# Patient Record
Sex: Male | Born: 1945 | Race: White | Hispanic: No | Marital: Married | Smoking: Never smoker
Health system: Southern US, Community
[De-identification: ages and names within clinical notes are randomized; demographics above are authoritative.]

## PROBLEM LIST (undated history)

## (undated) DIAGNOSIS — E119 Type 2 diabetes mellitus without complications: Secondary | ICD-10-CM

## (undated) DIAGNOSIS — K3184 Gastroparesis: Secondary | ICD-10-CM

## (undated) DIAGNOSIS — R011 Cardiac murmur, unspecified: Secondary | ICD-10-CM

## (undated) DIAGNOSIS — IMO0002 Reserved for concepts with insufficient information to code with codable children: Secondary | ICD-10-CM

## (undated) HISTORY — PX: CATARACT EXTRACTION, BILATERAL: SHX1313

## (undated) HISTORY — DX: Type 2 diabetes mellitus without complications: E11.9

## (undated) HISTORY — DX: Cardiac murmur, unspecified: R01.1

## (undated) HISTORY — DX: Gastroparesis: K31.84

## (undated) HISTORY — DX: Reserved for concepts with insufficient information to code with codable children: IMO0002

---

## 2000-04-22 ENCOUNTER — Emergency Department (HOSPITAL_COMMUNITY): Admission: EM | Admit: 2000-04-22 | Discharge: 2000-04-22 | Payer: Self-pay

## 2001-04-30 ENCOUNTER — Emergency Department (HOSPITAL_COMMUNITY): Admission: EM | Admit: 2001-04-30 | Discharge: 2001-04-30 | Payer: Self-pay | Admitting: Emergency Medicine

## 2001-04-30 ENCOUNTER — Encounter: Payer: Self-pay | Admitting: Emergency Medicine

## 2003-08-14 ENCOUNTER — Encounter: Payer: Self-pay | Admitting: Emergency Medicine

## 2003-08-14 ENCOUNTER — Emergency Department (HOSPITAL_COMMUNITY): Admission: EM | Admit: 2003-08-14 | Discharge: 2003-08-14 | Payer: Self-pay | Admitting: Emergency Medicine

## 2005-02-22 ENCOUNTER — Ambulatory Visit: Payer: Self-pay

## 2005-02-22 HISTORY — PX: COLONOSCOPY: SHX174

## 2005-05-17 ENCOUNTER — Ambulatory Visit: Payer: Self-pay | Admitting: Internal Medicine

## 2005-11-04 HISTORY — PX: CERVICAL DISC SURGERY: SHX588

## 2005-12-18 ENCOUNTER — Ambulatory Visit: Payer: Self-pay

## 2006-02-28 ENCOUNTER — Observation Stay (HOSPITAL_COMMUNITY): Admission: RE | Admit: 2006-02-28 | Discharge: 2006-03-01 | Payer: Self-pay | Admitting: Neurosurgery

## 2007-04-21 ENCOUNTER — Ambulatory Visit: Payer: Self-pay | Admitting: Internal Medicine

## 2007-11-20 ENCOUNTER — Ambulatory Visit: Payer: Self-pay

## 2008-07-24 IMAGING — US US CAROTID DUPLEX BILAT
1 series · 17 of 24 positions shown · non-contrast
Comparison: none

REASON FOR EXAM: Dizziness Blurred Vision Headache
COMMENTS:

PROCEDURE:     US  - US CAROTID DOPPLER BILATERAL  - November 20, 2007  [DATE]
RESULT:     Comparison: No available comparison exam.

[Series 1: us carotid duplex bilat · 17 of 65 slices shown]
[im 1/65]
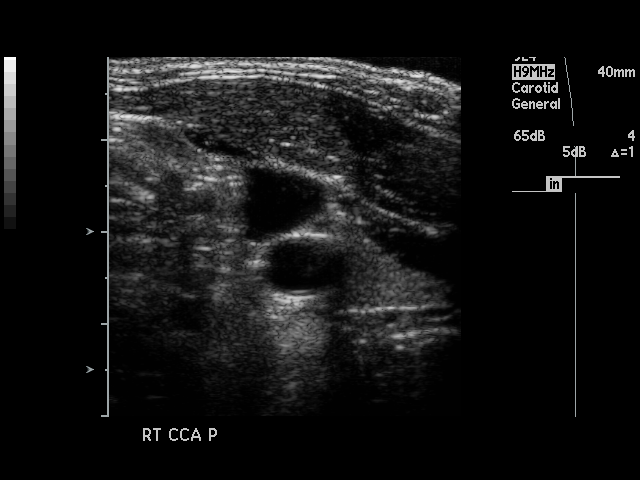
[im 6/65]
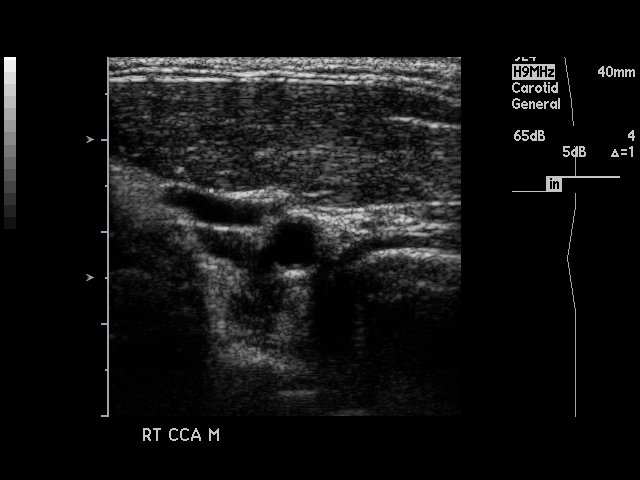
[im 9/65]
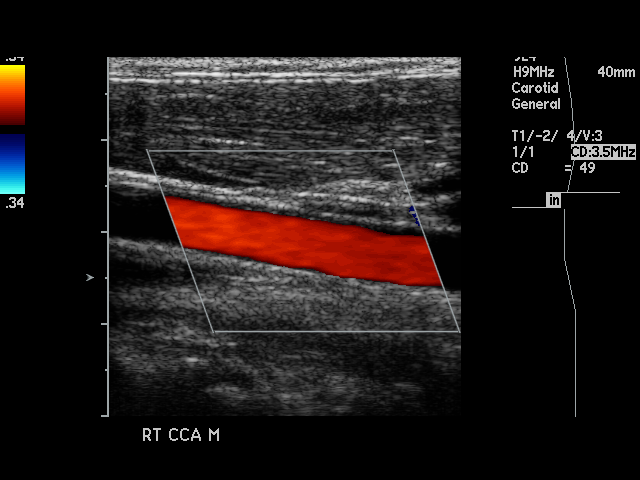
[im 12/65]
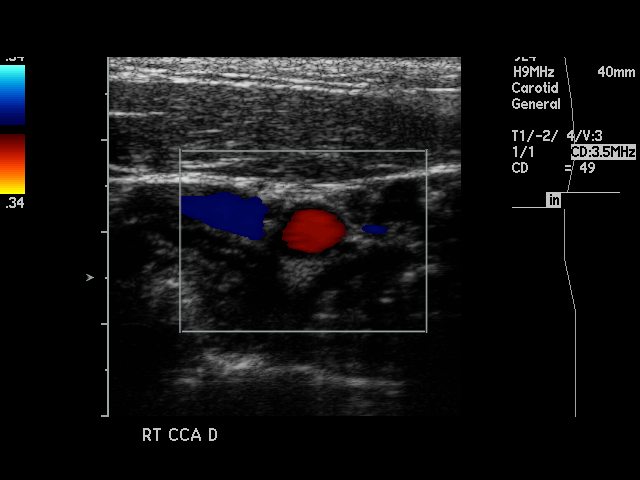
[im 17/65]
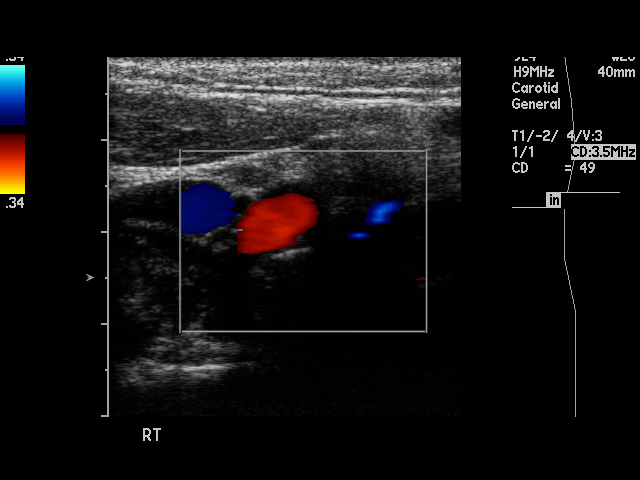
[im 20/65]
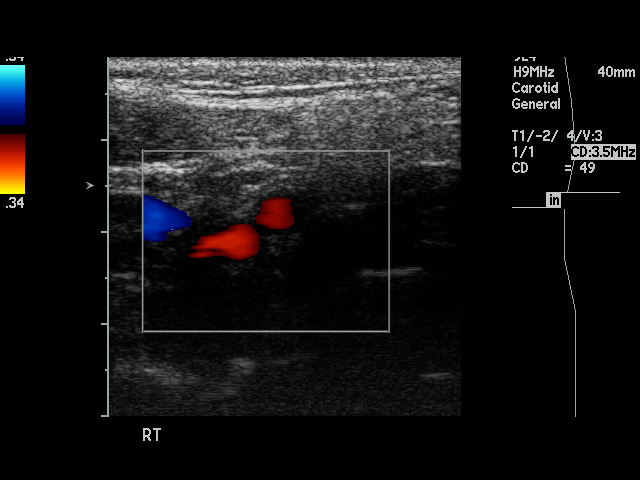
[im 26/65]
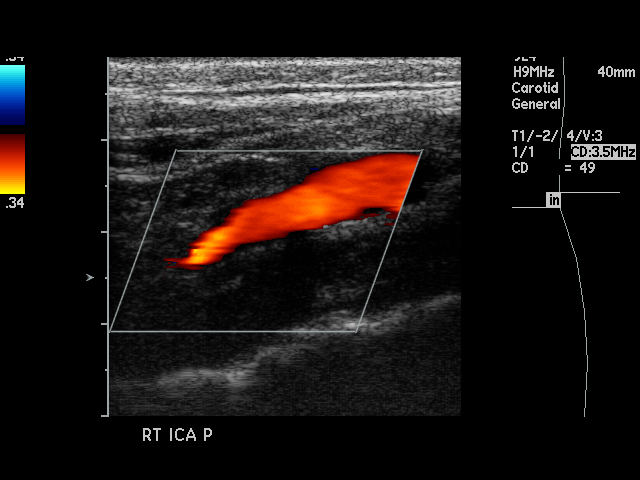
[im 28/65]
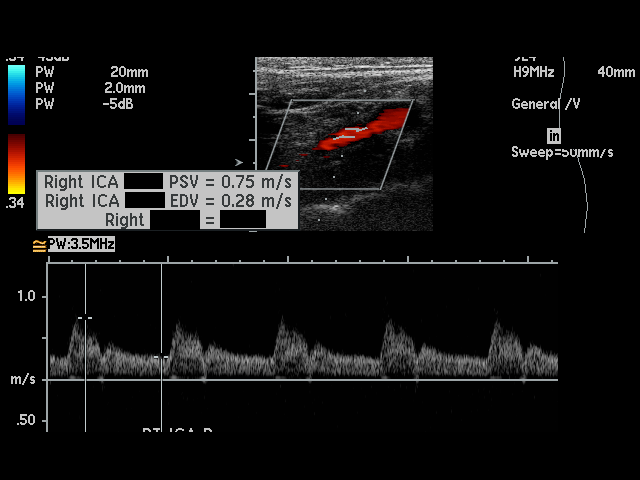
[im 34/65]
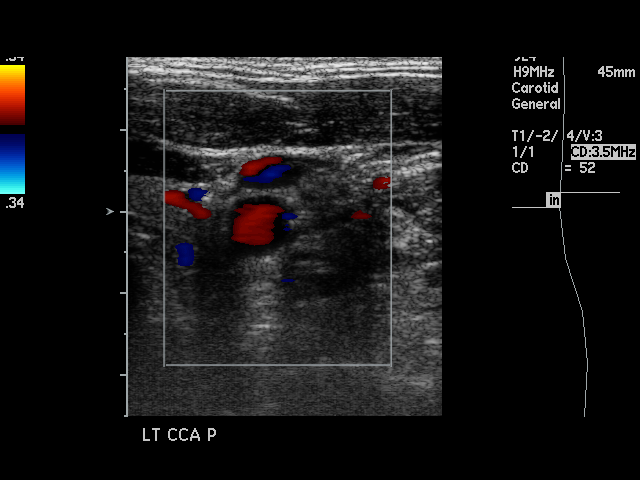
[im 37/65]
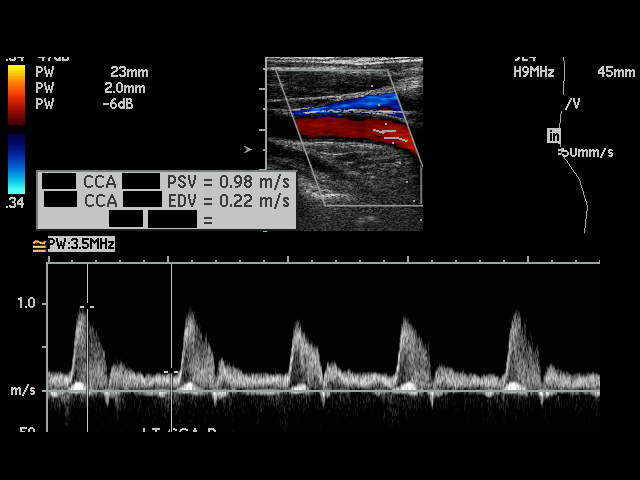
[im 39/65]
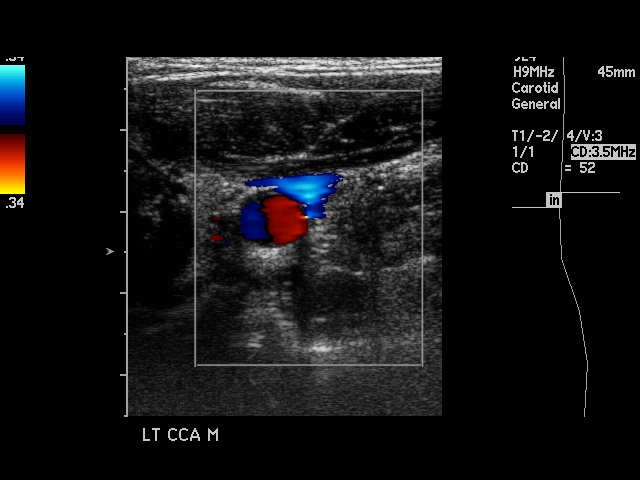
[im 45/65]
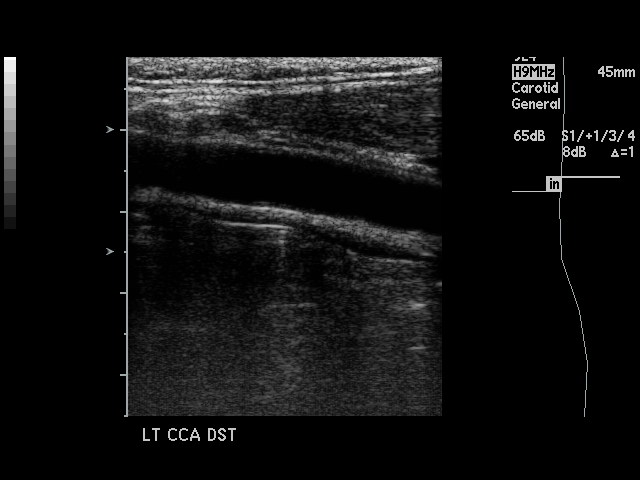
[im 48/65]
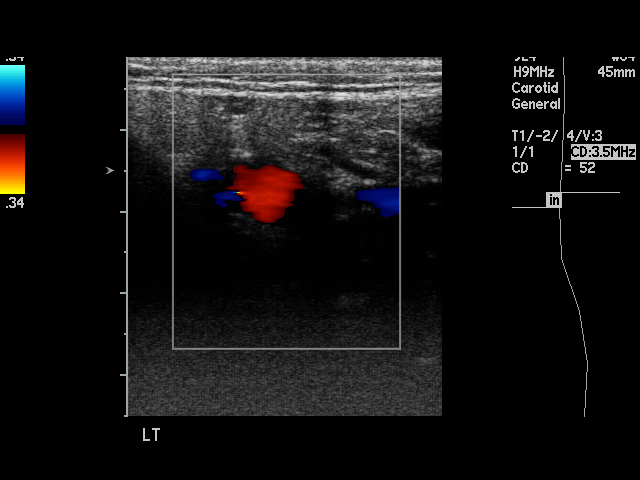
[im 53/65]
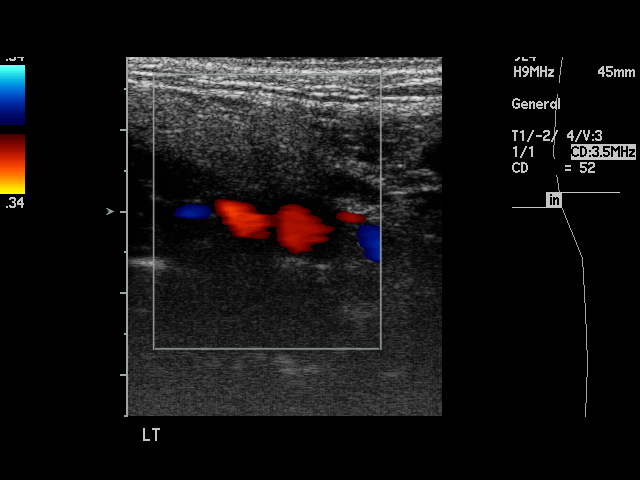
[im 56/65]
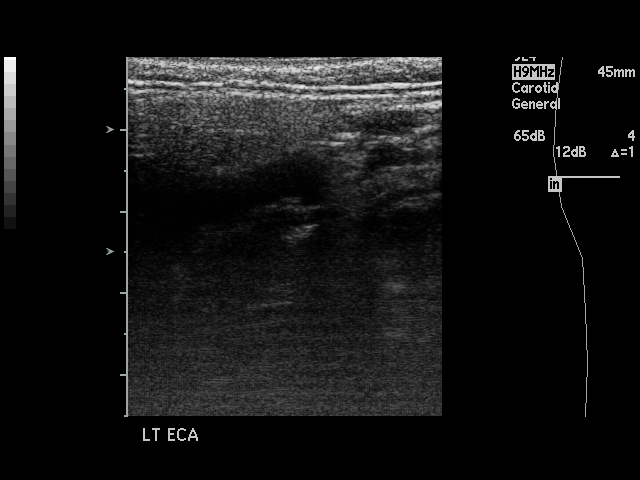
[im 59/65]
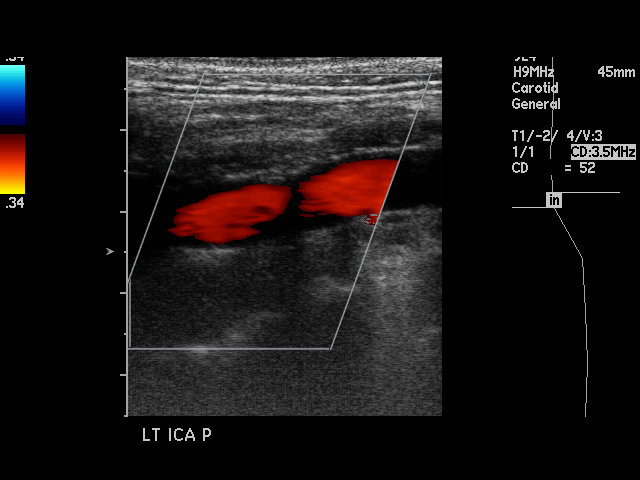
[im 65/65]
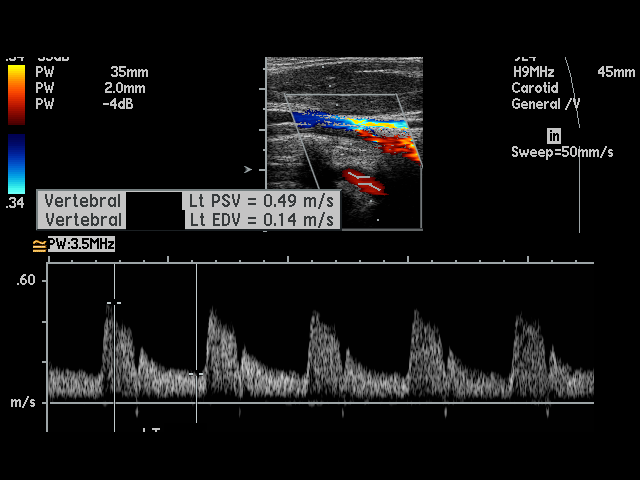

[17 of 24 positions shown; findings below may reference images not displayed]

FINDINGS: RIGHT:
The common carotid artery peak systolic velocity is 99 cm/s.
The internal carotid artery  peak systolic velocity is 92 cm/s.
The ratio of the  ICA/CCA is 0.93.
The external carotid artery is patent and has antegrade flow.
Antegrade flow identified in the vertebral artery.

No significant atherosclerotic changes are noted.

LEFT
The common carotid artery peak systolic velocity is 100 cm/s.
The internal carotid artery  peak systolic velocity is 86 cm/s.
The ratio of the  ICA/CCA is 0.86.
The external carotid artery is patent and has antegrade flow.
Antegrade flow identified in the vertebral artery.

Mild noncalcified plaque is seen involving the carotid bulb.
IMPRESSION: 1. Mild noncalcified plaque is seen involving the left carotid bulb without
evidence of hemodynamically significant stenosis.

## 2012-04-16 LAB — BASIC METABOLIC PANEL
Anion Gap: 7 (ref 7–16)
Calcium, Total: 10.2 mg/dL — ABNORMAL HIGH (ref 8.5–10.1)
Chloride: 99 mmol/L (ref 98–107)
Co2: 30 mmol/L (ref 21–32)
Creatinine: 1.2 mg/dL (ref 0.60–1.30)
Sodium: 136 mmol/L (ref 136–145)

## 2012-04-16 LAB — CBC
HCT: 40.9 % (ref 40.0–52.0)
MCH: 30.9 pg (ref 26.0–34.0)
Platelet: 222 10*3/uL (ref 150–440)
RBC: 4.51 10*6/uL (ref 4.40–5.90)
RDW: 12.6 % (ref 11.5–14.5)
WBC: 10.4 10*3/uL (ref 3.8–10.6)

## 2012-04-16 LAB — CK TOTAL AND CKMB (NOT AT ARMC): CK, Total: 146 U/L (ref 35–232)

## 2012-04-17 ENCOUNTER — Inpatient Hospital Stay: Payer: Self-pay | Admitting: Internal Medicine

## 2012-04-17 LAB — CBC WITH DIFFERENTIAL/PLATELET
Basophil #: 0 10*3/uL (ref 0.0–0.1)
Basophil %: 0.3 %
Eosinophil #: 0.1 10*3/uL (ref 0.0–0.7)
HCT: 39.9 % — ABNORMAL LOW (ref 40.0–52.0)
Lymphocyte #: 2.8 10*3/uL (ref 1.0–3.6)
MCH: 31.3 pg (ref 26.0–34.0)
MCHC: 34.4 g/dL (ref 32.0–36.0)
MCV: 91 fL (ref 80–100)
Platelet: 221 10*3/uL (ref 150–440)
RBC: 4.38 10*6/uL — ABNORMAL LOW (ref 4.40–5.90)
RDW: 12.7 % (ref 11.5–14.5)
WBC: 10.7 10*3/uL — ABNORMAL HIGH (ref 3.8–10.6)

## 2012-04-17 LAB — COMPREHENSIVE METABOLIC PANEL
Albumin: 3.9 g/dL (ref 3.4–5.0)
Chloride: 102 mmol/L (ref 98–107)
Creatinine: 1.16 mg/dL (ref 0.60–1.30)
EGFR (African American): >60
EGFR (Non-African Amer.): >60
SGOT(AST): 14 U/L — ABNORMAL LOW (ref 15–37)
Sodium: 138 mmol/L (ref 136–145)
Total Protein: 7.5 g/dL (ref 6.4–8.2)

## 2012-04-17 LAB — TROPONIN I
Troponin-I: 0.02 ng/mL
Troponin-I: <0.02 ng/mL

## 2012-04-17 LAB — LIPID PANEL
HDL Cholesterol: 41 mg/dL (ref 40–60)
Ldl Cholesterol, Calc: 111 mg/dL — ABNORMAL HIGH (ref 0–100)
Triglycerides: 68 mg/dL (ref 0–200)

## 2012-04-17 LAB — CK TOTAL AND CKMB (NOT AT ARMC): CK-MB: 3.4 ng/mL (ref 0.5–3.6)

## 2012-04-27 HISTORY — PX: AORTIC VALVE REPLACEMENT (AVR)/CORONARY ARTERY BYPASS GRAFTING (CABG): SHX5725

## 2012-05-14 ENCOUNTER — Ambulatory Visit: Payer: Self-pay | Admitting: Internal Medicine

## 2012-12-15 HISTORY — PX: UPPER GASTROINTESTINAL ENDOSCOPY: SHX188

## 2012-12-20 IMAGING — US ABDOMEN ULTRASOUND LIMITED
1 series · 14 of 25 positions shown · non-contrast
Comparison: none

REASON FOR EXAM: ruq pain, leukocytosis
COMMENTS:   Body Site: GB and Fossa, CBD, Head of Pancreas

PROCEDURE:     US  - US ABDOMEN LIMITED SURVEY  - April 17, 2012  [DATE]
RESULT:

[Series 1: abdomen ultrasound limited · 0.31mm/px · 14 of 57 slices shown]
[im 1/57]
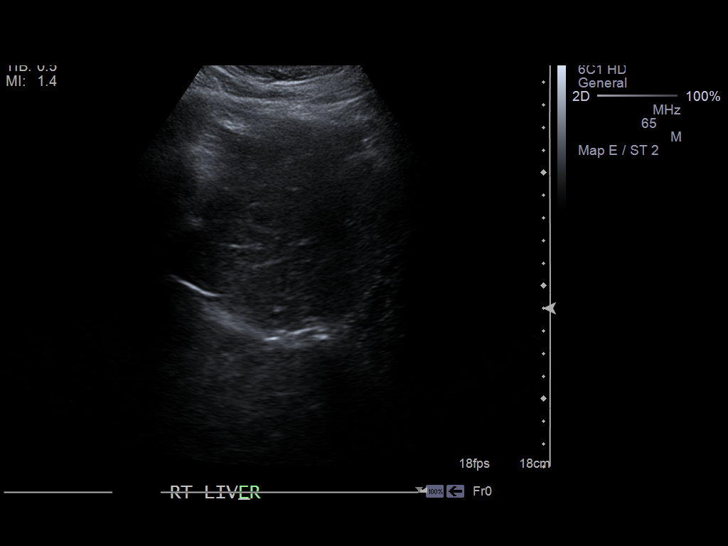
[im 5/57]
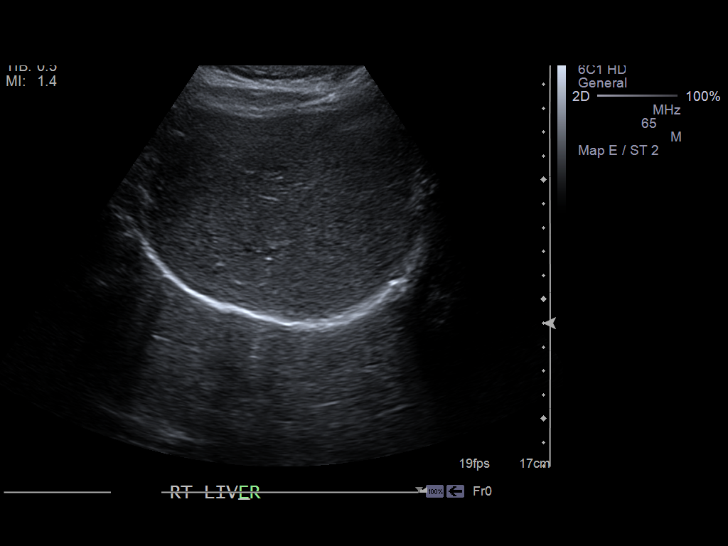
[im 10/57]
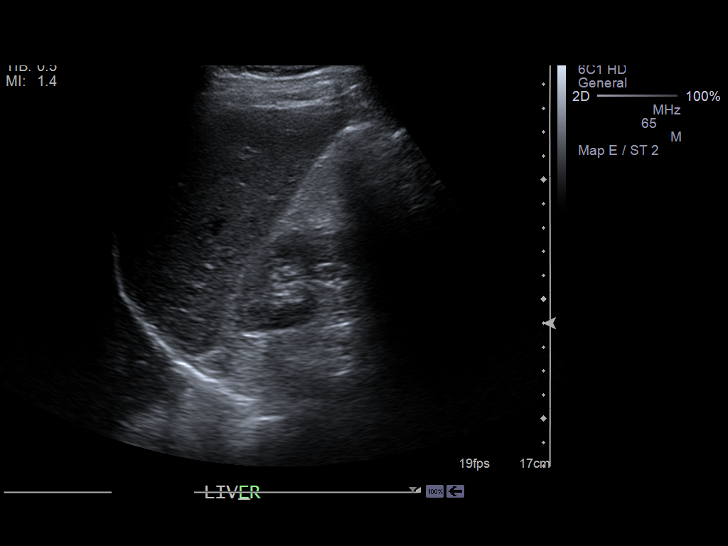
[im 15/57]
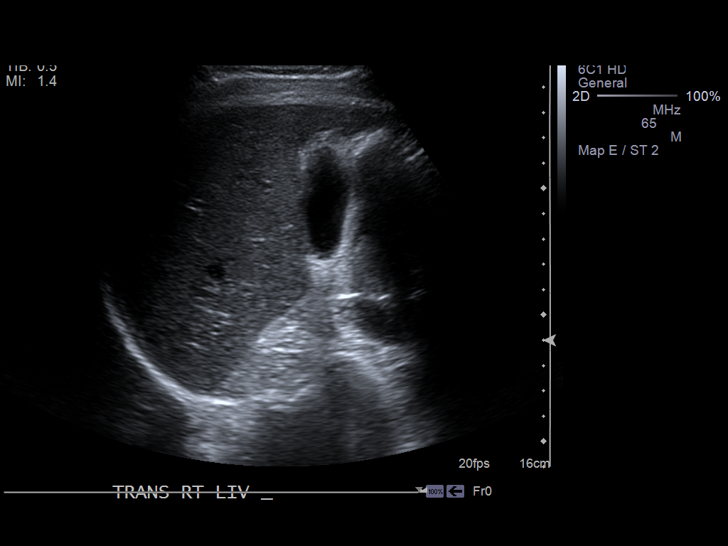
[im 19/57]
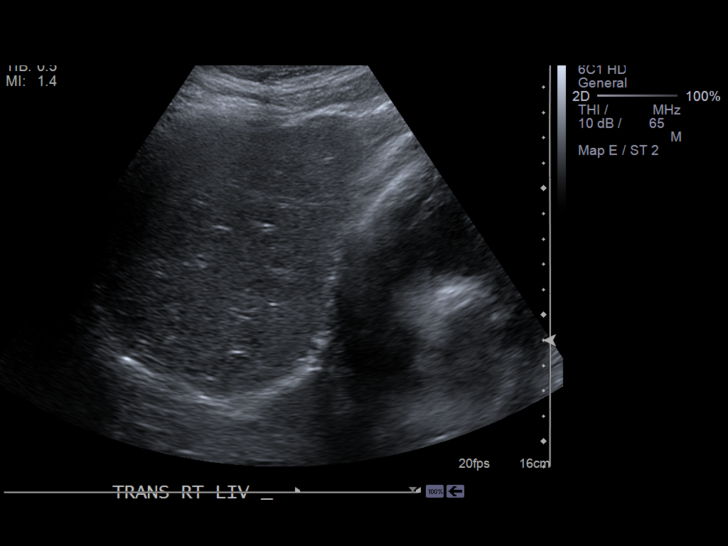
[im 22/57]
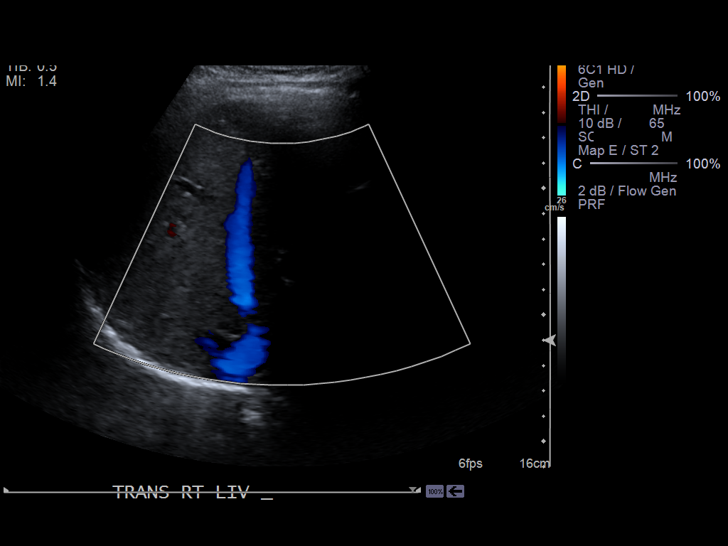
[im 26/57]
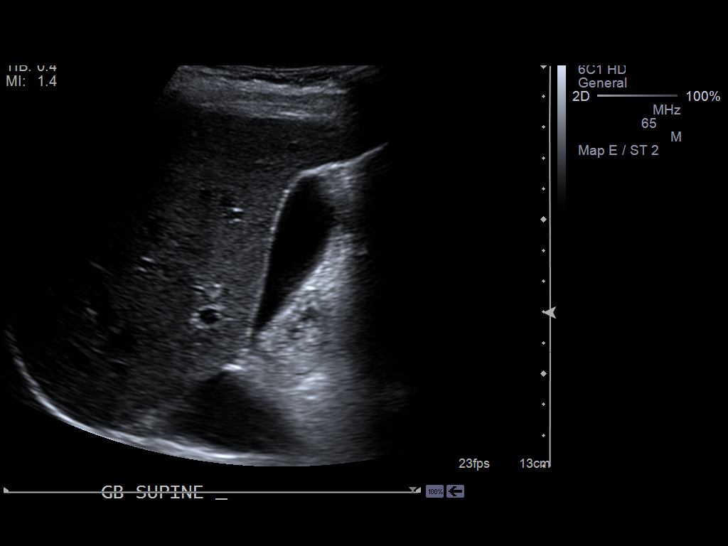
[im 31/57]
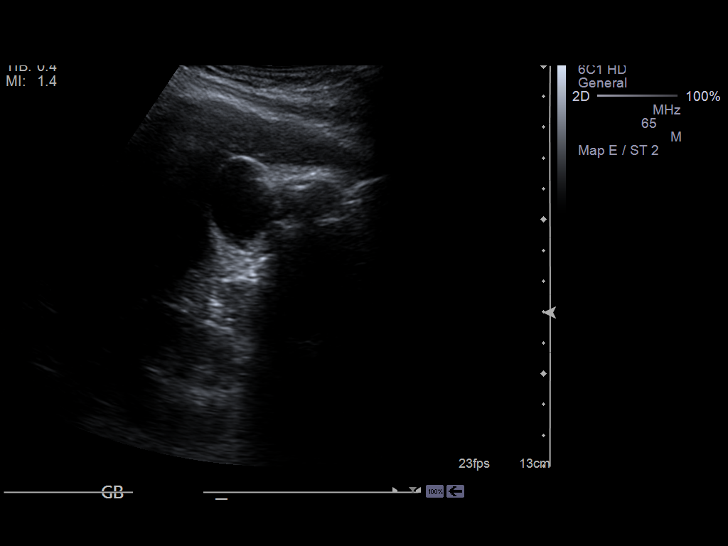
[im 36/57]
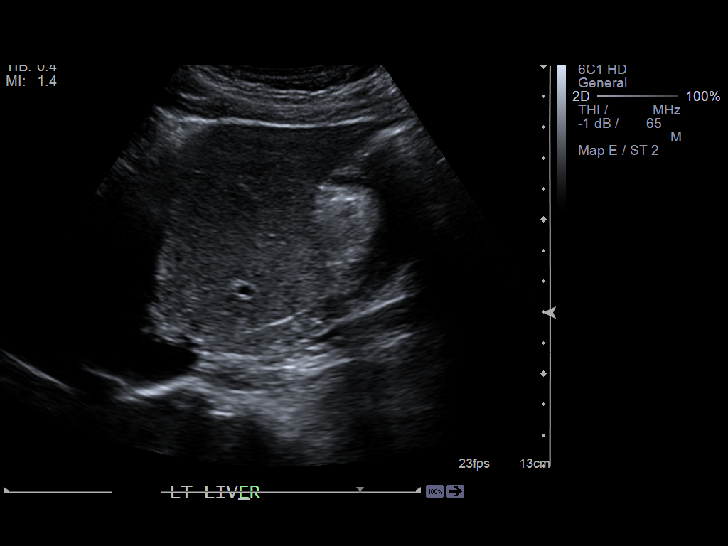
[im 38/57]
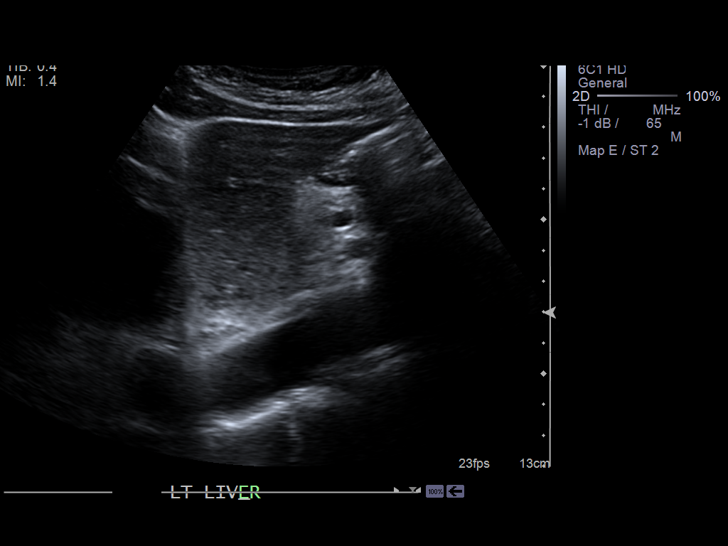
[im 43/57]
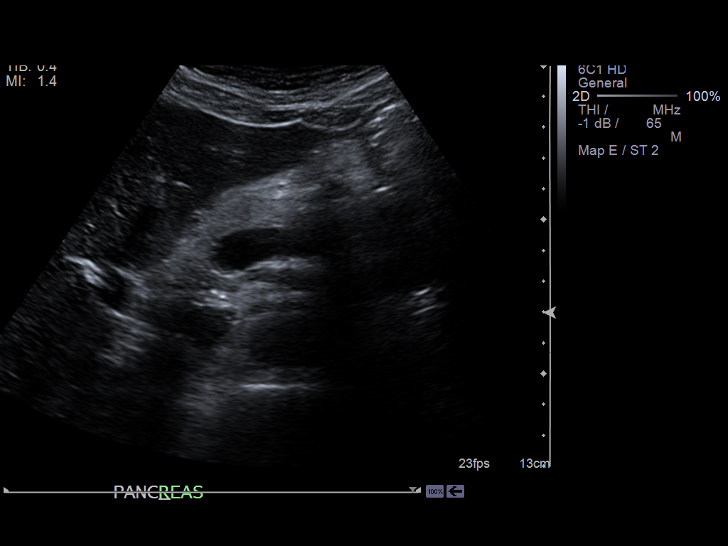
[im 47/57]
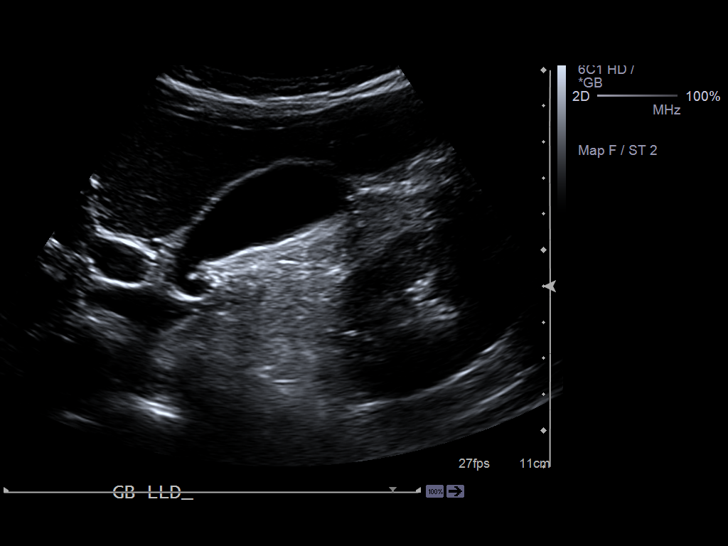
[im 52/57]
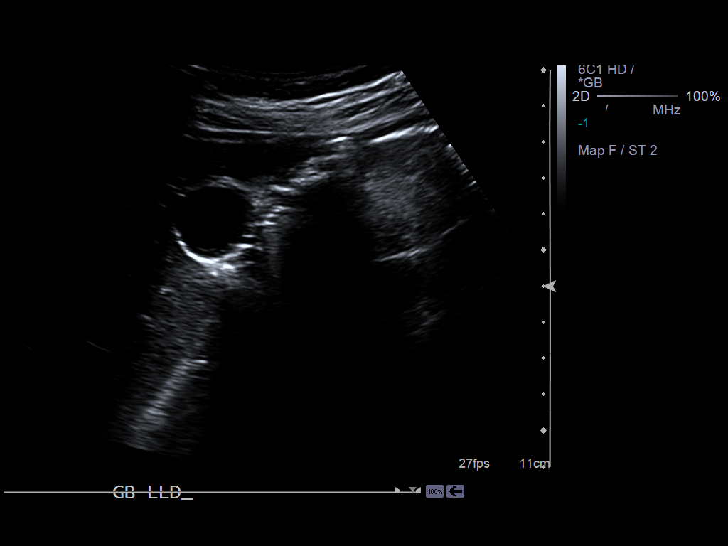
[im 57/57]
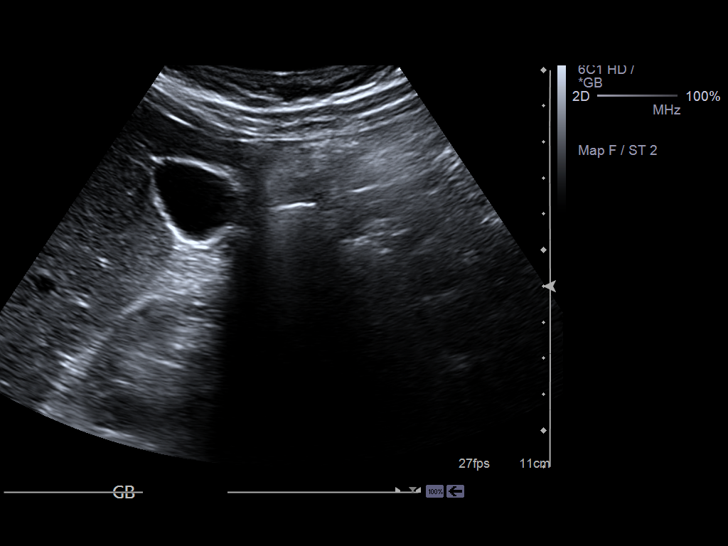

[14 of 25 positions shown; findings below may reference images not displayed]

FINDINGS: The liver demonstrates a homogeneous echotexture. Evaluation of
the gallbladder fossa demonstrates no evidence of pericholecystic fluid,
gallstones, sludging nor gallbladder wall thickening. Gallbladder wall
thickness is 1 mm. The common bile duct measures 4.1 mm in diameter. The
pancreatic head is unremarkable. Hepatopetal flow is identified within the
porta hepatis.
IMPRESSION: Unremarkable right upper quadrant abdominal ultrasound.

## 2013-01-16 IMAGING — US US EXTREM LOW VENOUS*L*
1 series · 14 of 23 positions shown · non-contrast
Comparison: none

REASON FOR EXAM: edema  eval DVT  dr office requested [REDACTED]
COMMENTS:

[Series 1: us extrem low venous*left* · 0.09mm/px · 14 of 23 slices shown]
[im 1/23]
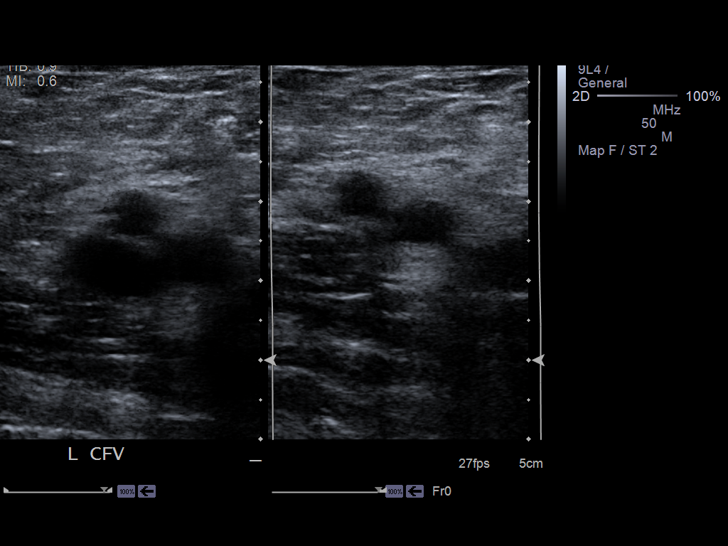
[im 3/23]
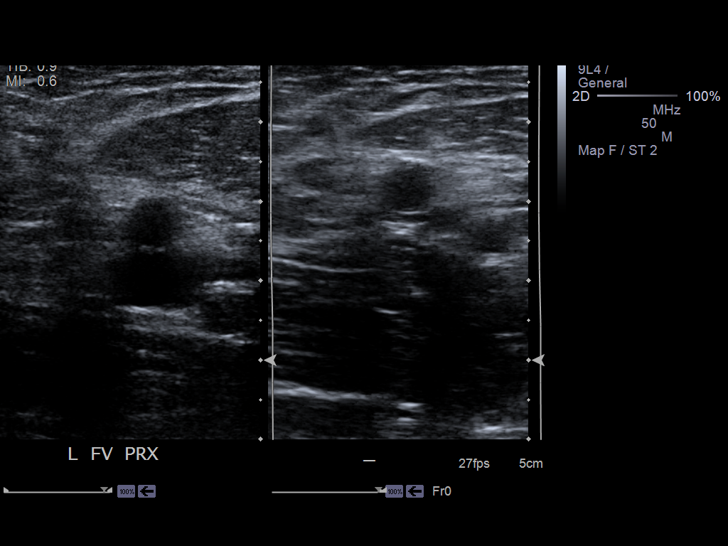
[im 5/23]
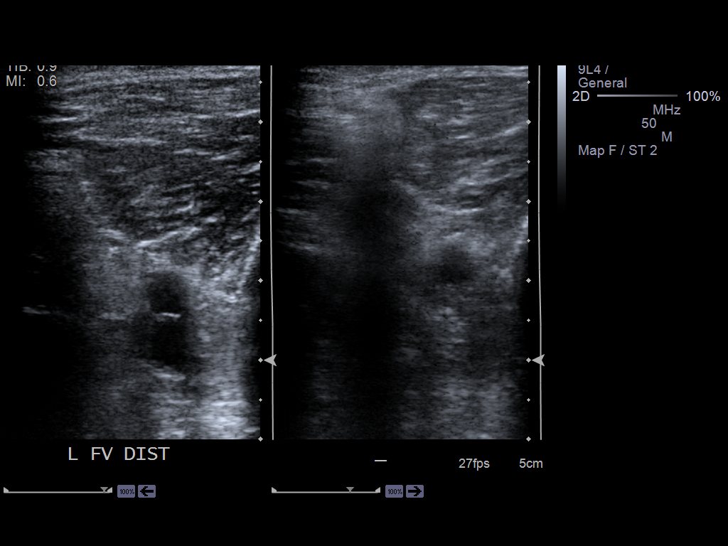
[im 6/23]
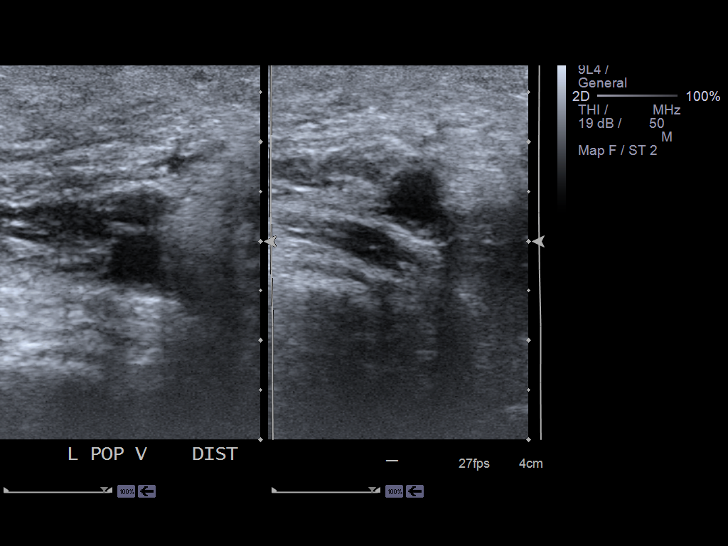
[im 8/23]
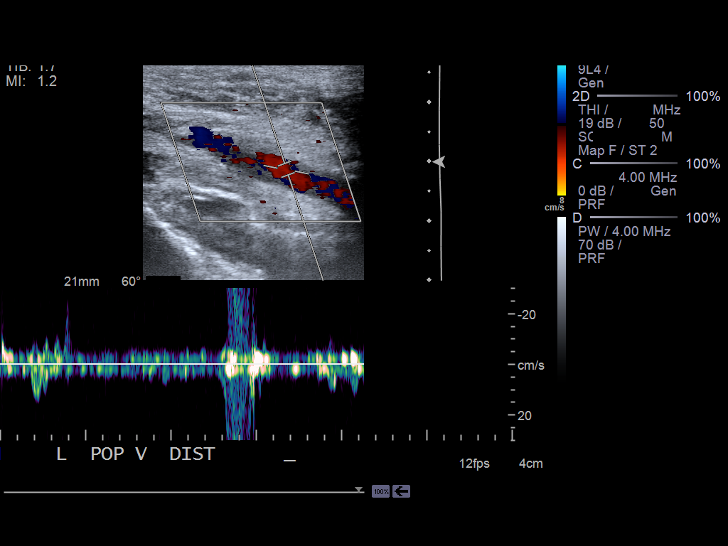
[im 10/23]
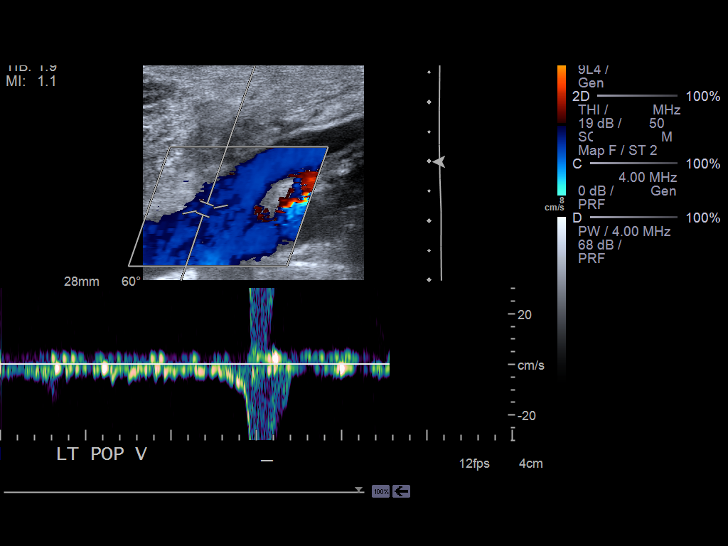
[im 11/23]
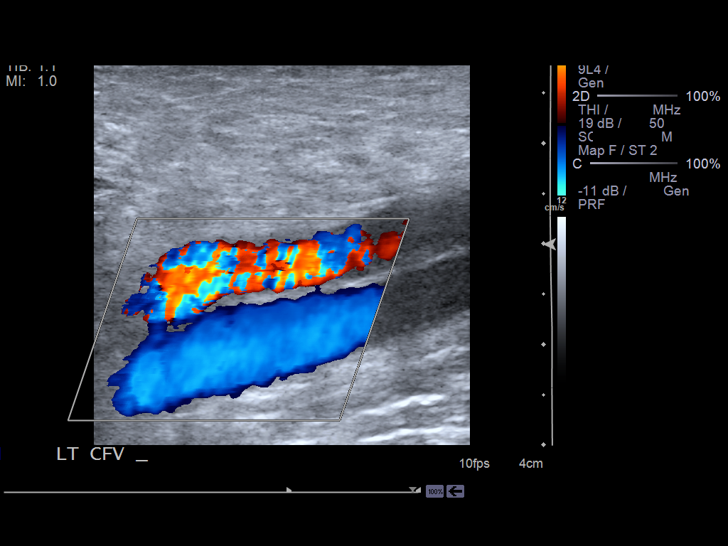
[im 13/23]
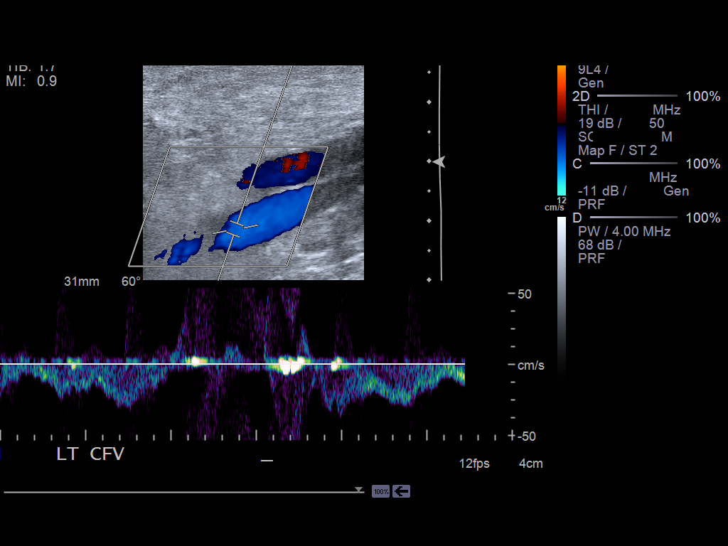
[im 14/23]
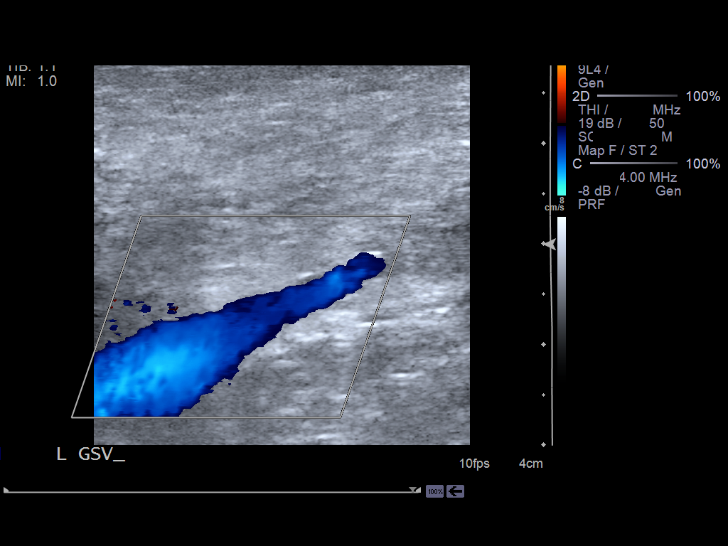
[im 16/23]
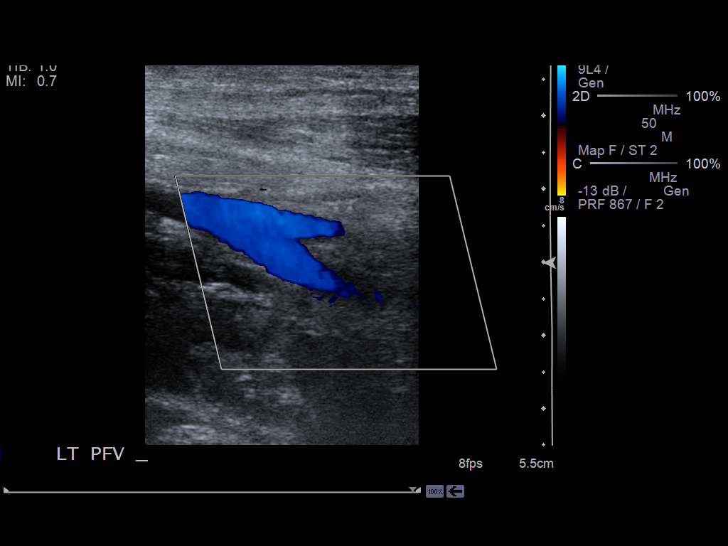
[im 18/23]
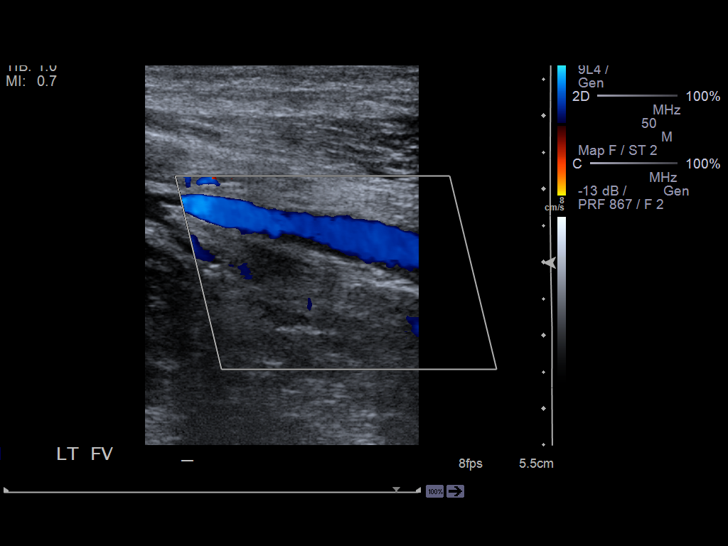
[im 19/23]
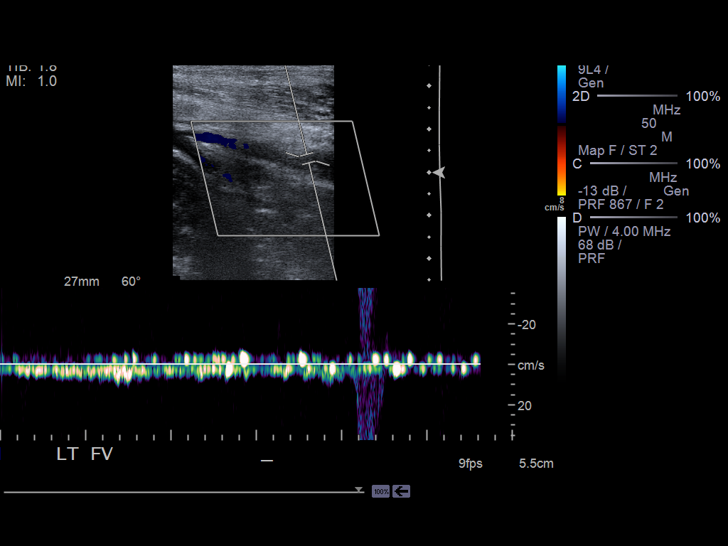
[im 21/23]
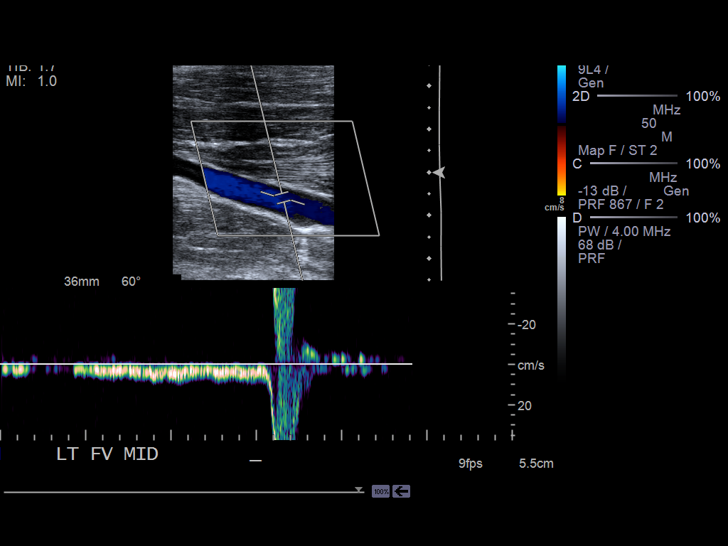
[im 23/23]
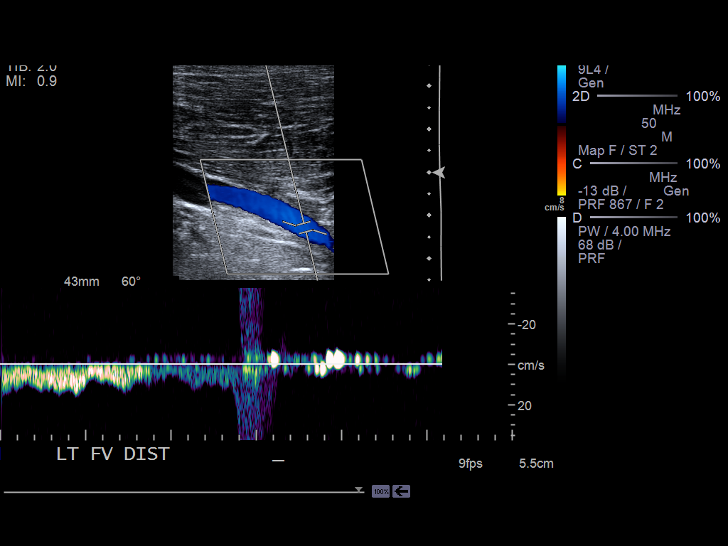

[14 of 23 positions shown; findings below may reference images not displayed]

PROCEDURE:     US  - US DOPPLER LOW EXTR LEFT  - May 14, 2012  [DATE]

RESULT:     Comparison: None

Technique and findings: Multiple longitudinal and transverse grayscale as
well as color and spectral Doppler images of the left lower extremity veins
were obtained from the common femoral veins through the popliteal veins.

The left common femoral, femoral, and popliteal veins are patent,
demonstrating normal color-flow and compressibility. No intraluminal
thrombus is identified.  There is normal respiratory variation and
augmentation demonstrated at all vein levels.
IMPRESSION: No evidence of DVT in the left lower extremity.

[REDACTED]

## 2014-04-18 ENCOUNTER — Ambulatory Visit (AMBULATORY_SURGERY_CENTER): Payer: Medicare Other | Admitting: *Deleted

## 2014-04-18 ENCOUNTER — Telehealth: Payer: Self-pay | Admitting: *Deleted

## 2014-04-18 VITALS — Ht 68.0 in | Wt 145.4 lb

## 2014-04-18 DIAGNOSIS — Z86010 Personal history of colonic polyps: Secondary | ICD-10-CM

## 2014-04-18 MED ORDER — MOVIPREP 100 G PO SOLR: ORAL | Status: AC

## 2014-04-18 NOTE — Telephone Encounter (Signed)
Patient is direct screening colonoscopy on 05/03/14. Records given during pre-visit for colon & EGD and labs. Last colonoscopy was 02/22/2005, hyperplastic polyps. Patient denies any GI concerns, but does have a 1 st cousin with colon cancer age 135. Patient was referred by Dr.Miller (PCP). Records on desk for review. Ok for direct colonoscopy at this time? Thank you,Luca Dyar

## 2014-04-18 NOTE — Progress Notes (Signed)
Patient denies any allergies to eggs or soy. Patient gets nauseated with anesthesia, no problems with sedation. Patient denies any oxygen use at home and does not take any diet/weight loss medications. Patient does not use computer. Phone note sent to Dr.Pyrtle and records on desk for review.

## 2014-04-19 NOTE — Telephone Encounter (Signed)
Colonoscopy reviewed from 02/22/2005 Complete colonoscopy with exam to the cecum with a good bowel prep 2 small polyps removed from the left colon were hyperplastic. No reported history of adenomatous colon polyp Based on current guidelines, repeat colonoscopy for screening would be due April 2016

## 2014-04-19 NOTE — Telephone Encounter (Signed)
Pt notified recall colonoscopy due 02/2015.  Colonoscopy for 6/30 cancelled.  Recall entered into EPIC

## 2014-05-03 ENCOUNTER — Encounter: Payer: Self-pay | Admitting: Internal Medicine

## 2015-01-19 ENCOUNTER — Encounter: Payer: Self-pay | Admitting: Internal Medicine

## 2015-02-26 NOTE — Consult Note (Signed)
PATIENT NAME:  Douglas Haney, GRANQUIST MR#:  [id number] DATE OF BIRTH:  1945-12-11  DATE OF CONSULTATION:  04/17/2012  REFERRING PHYSICIAN:  Bethann Punches, MD   CONSULTING PHYSICIAN:  Lamar Blinks, MD  PRIMARY CARE PHYSICIAN:  Bethann Punches, MD  REASON FOR CONSULTATION: Unstable angina, diabetes mellitus, hypertension, hyperlipidemia, and aortic valve stenosis.   CHIEF COMPLAINT: "I'm short of breath."   HISTORY OF PRESENT ILLNESS: This is a 69 year old male with significant moderate aortic stenosis, progressive over the last multiple years to mild to moderate with known diabetes mellitus under reasonable control. He has had hypertension and hyperlipidemia on appropriate medications. He has had new onset of significant weakness, fatigue, and shortness of breath with physical activity, relieved by rest, and some mild dizziness. This occurs with and without physical activity, but mainly with physical activity, relieved by rest over the last several weeks. He has come to the hospital with no evidence of myocardial infarction. EKG shows normal sinus rhythm, normal EKG. He has not had any other significant lung issues in the recent past.   REVIEW OF SYSTEMS: The remainder of review of systems is negative for vision change, ringing in the ears, hearing loss, cough, congestion, heartburn, nausea, vomiting, diarrhea, bloody stool, stomach pain, extremity pain, leg weakness, cramping of the buttocks, known blood clots, headaches, blackouts, dizzy spells, nosebleeds, congestion, trouble swallowing, frequent urination, urination at night, muscle weakness, numbness, anxiety, depression, skin lesions, skin rashes.   PAST MEDICAL HISTORY:  1. Hypertension.  2. Hyperlipidemia.  3. Aortic stenosis.  4. Diabetes mellitus.   FAMILY HISTORY: No family members with early onset of cardiovascular disease.   SOCIAL HISTORY: Currently denies alcohol or tobacco use.   ALLERGIES: He has no known drug allergies.    CURRENT MEDICATIONS: As listed.   PHYSICAL EXAMINATION:  VITAL SIGNS: Blood pressure is 116/80 bilaterally, heart rate 70 upright, reclining, and regular.   GENERAL: He is a well appearing male in no acute distress.   HEENT: No icterus, thyromegaly, ulcers, hemorrhage, or xanthelasma.   HEART: Regular rate and rhythm. Normal S1, soft S2 with a 3/6 right upper sternal border murmur radiating throughout and into the carotids. Point of maximal impulse is diffuse. Carotid upstroke normal without bruit. Jugular venous pressure is normal.   LUNGS: Lungs have few basilar crackles with normal respirations.   ABDOMEN: Soft, nontender, without hepatosplenomegaly or masses. Abdominal aorta is normal size without bruit.   EXTREMITIES: 2+ bilateral pulses in dorsal pedal, radial, and femoral arteries without lower extremity edema, cyanosis, clubbing, or ulcers.   NEUROLOGIC: He is oriented to time, place, and person with normal mood and affect.   ASSESSMENT: 69 year old male with hypertension, hyperlipidemia, diabetes mellitus, and aortic valve stenosis with progressive episodes of angina and/or progressive aortic valve stenosis.   RECOMMENDATIONS:  1. Echocardiogram for LV systolic dysfunction, valvular heart disease causing current symptoms of possible congestive heart failure or worsening aortic stenosis.  2. Proceed to cardiac catheterization to assess coronary anatomy and further treatment thereof as necessary. The patient understands the risks and benefits of cardiac catheterization. These include the possibly of death, stroke, heart attack, infection, bleeding, or blood clot. He is at low risk for conscious sedation.  3. No change in diabetes medication management for a goal hemoglobin A1c below 7.  4. Lipid management with a goal LDL below 70 mg/dL. 5. Hypertension control with ACE inhibitor if able for further renal protection.  6. Further treatment options after above.  ____________________________ Lamar BlinksBruce J. Mister Krahenbuhl, MD bjk:bjt D: 04/17/2012 12:53:47 ET T: 04/17/2012 15:18:19 ET JOB#: [id number]  cc: Lamar BlinksBruce J. Daiden Coltrane, MD, <Dictator> Lamar BlinksBRUCE J Forrester Blando MD ELECTRONICALLY SIGNED 04/22/2012 10:40

## 2015-02-26 NOTE — H&P (Signed)
PATIENT NAME:  Douglas Haney, Douglas Haney MR#:  [id number] DATE OF BIRTH:  October 31, 1946  DATE OF ADMISSION:  04/16/2012  PRIMARY CARE PHYSICIAN:  Dr. Bethann Punches  ER PHYSICIAN: Dr. Carollee Massed   CHIEF COMPLAINT: Chest pain.  HISTORY OF PRESENT ILLNESS: 69 year old male with history of diabetes and neck pain came in because of chest pain. The chest pain started this morning around 10:00. The patient had left-sided chest pain associated with some nausea. The patient also was a little diaphoretic. No vomiting and no radiation of pain. The chest pain is more like a pressure-like pain. The patient says that it actually persisted for 20 minutes and he is chest pain free at this time. The patient also has exertional dyspnea going on for two weeks. According to the wife, the patient has been having some nausea and also sick to his stomach and unable to keep anything down in terms of not eating much for the last one week. No abdominal pain, a lot of belching and bloating. The patient also has some weight loss according to the wife recently, unable to tell me how much.  PAST MEDICAL HISTORY:  1. Diabetes. 2. History of back pain because of cervical fusion mainly in the upper neck area.   ALLERGIES: The patient is allergic to Relafen and sulfa.   SOCIAL HISTORY: The patient has no smoking, no drinking, no drugs.   PAST SURGICAL HISTORY:  1. Neck surgery. 2. Vasectomy. 3. Tonsillectomy.  MEDICATIONS: 1. Januvia 25 mg daily.  2. Ambien 5 mg at night. 3. Metformin. He takes it every week or so, 500 mg daily. According to him, the patient was told to take it when the sugars are high, so he is not taking metformin every day.  4. Norco 5/325 every day.  5. Flexeril 10 mg t.i.d. p.r.n.   FAMILY HISTORY: Significant for diabetes and hypertension on father's side.    REVIEW OF SYSTEMS: CONSTITUTIONAL: No fever, no weakness. EYES: No blurred vision. ENT: No tinnitus. No epistaxis. No difficulty swallowing. RESPIRATORY:  No cough. No wheezing. CARDIOVASCULAR: The patient has chest pain, some exertional dyspnea. No orthopnea or pedal edema. No palpitations. GI: Has some nausea, bloating, and also frequent belching. GU: No dysuria. ENDOCRINE: No polyuria. Has diabetes. No peripheral neuropathy. MUSCULOSKELETAL: Has neck pain because of neck fusion surgery with plates in the neck. The patient is also on Flexeril and Percocet for that. NEUROLOGIC: No numbness or weakness. PSYCH: No anxiety or insomnia.   PHYSICAL EXAMINATION:  VITAL SIGNS: Temperature 97, blood pressure 172/80 in the ER, repeat 134/77, respirations 18.  GENERAL: He is alert, awake, oriented, answering questions appropriately.   HEENT: Head atraumatic, normocephalic. Pupils equally reacting to light. Extraocular movements are intact.   ENT: No tympanic membrane congestion. No turbinate hypertrophy. No oropharyngeal erythema.   NECK: Normal range of motion. No JVD. No carotid bruit.   CARDIOVASCULAR: S1 and S2 regular. No murmurs.   LUNGS: Clear to auscultation. No wheeze. No rales.   ABDOMEN: Soft, nontender, nondistended. Bowel sounds present.   MUSCULOSKELETAL: Strength 5/5 in upper and lower extremities.   SKIN: No skin rashes.   NEUROLOGIC: Cranial nerves II through XII intact. Power 5/5 upper and lower extremities. Sensation is intact. Deep tendon reflexes 2+ bilaterally. No dysarthria or aphasia.   PSYCH: Oriented to time, place, person. Cooperative.   LABORATORY, DIAGNOSTIC, AND RADIOLOGICAL DATA: Chest x-ray: No acute cardiopulmonary disease. CBC: WBC 10.4, hemoglobin 13.9, hematocrit 40.9, platelets 222.   Electrolytes:  Sodium 136,  potassium 3.8, chloride 99, bicarbonate 30, BUN is 23, creatinine 1.2, glucose 281. Troponin is 0.02, CK total 146, CPK-MB 6.5. Blood glucose 286. EKG: Normal sinus, 74 beats per minute. T-wave inversion in V1 and V2.   ASSESSMENT AND PLAN:  1. The patient is a 69 year old male with chest pain with  risk factors of diabetes. The patient is going to be placed on observation for chest pain, rule out possible unstable angina. He is on aspirin, beta blockers, and also nitroglycerin along with Lovenox. Continue to monitor the troponins and get cardiology consult. The patient also probably has gastritis. Continue PPIs. He has seen Dr. Mechele CollinElliott before and had H. pylori as well so we will give him PPI. Check H. pylori serology and give Zofran for nausea.  2. Diabetes. He is on Januvia.  Continue Januvia 25 mg daily. Check hemoglobin A1c and continue sliding scale coverage.  The patient's condition was discussed with the wife.   TIME SPENT ON HISTORY AND PHYSICAL: About 55 minutes.   ____________________________ Katha HammingSnehalatha Tristine Langi, MD sk:bjt D: 04/16/2012 15:00:59 ET T: 04/16/2012 16:05:34 ET JOB#: [id number]  cc: Katha HammingSnehalatha Lydell Moga, MD, <Dictator> Danella PentonMark F. Miller, MD Katha HammingSNEHALATHA Teja Costen MD ELECTRONICALLY SIGNED 04/28/2012 0:03

## 2015-02-26 NOTE — Discharge Summary (Signed)
PATIENT NAME:  Douglas Haney, Douglas Haney MR#:  [id number] DATE OF BIRTH:  May 31, 1946  DATE OF ADMISSION:  04/17/2012 DATE OF DISCHARGE:  04/18/2012  DISCHARGE DIAGNOSES:  1. Coronary artery disease, unstable angina.  2. Cervical disk disease.  3. Diabetes mellitus, non-insulin-requiring.   DISCHARGE MEDICATIONS:  1. Januvia 50 mg daily.  2. Nitroglycerin patch 0.3 mg topical daily.  3. Protonix 40 mg twice a day. 4. Metoprolol 25 mg twice a day.  REASON FOR ADMISSION: This is a 69 year old male who presents with exertional chest pain, fatigue. Please see the History and Physical for history of present illness, past medical history, and physical exam.   HOSPITAL COURSE: The patient was admitted and underwent right upper quadrant ultrasound, which was negative. Heart catheterization per Dr. Gwen PoundsKowalski showed severe single vessel disease requiring PCI. The family requested transfer. [facility] His overall prognosis is good.  ____________________________ Danella PentonMark F. Tyjae Shvartsman, MD mfm:slb D: 04/27/2012 08:13:27 ET T: 04/27/2012 10:18:34 ET JOB#: [id number]  cc: Danella PentonMark F. Aldeen Riga, MD, <Dictator> Dorman Calderwood Sherlene ShamsF Yordy Matton MD ELECTRONICALLY SIGNED 04/28/2012 7:59

## 2015-02-26 NOTE — Consult Note (Signed)
I came to see patient but he was getting a cath.  Will see in morning.  Electronic Signatures: Scot JunElliott, Rusti Arizmendi T (MD)  (Signed on 14-Jun-13 18:58)  Authored  Last Updated: 14-Jun-13 18:58 by Scot JunElliott, Judge Duque T (MD)

## 2015-02-26 NOTE — Consult Note (Signed)
Pt with signif CAD of LAD and aortic stenosis.  Recommend empiric PPI therapy in case any esophagitis exists but we have no plans for EGD at this time.  Electronic Signatures: Scot JunElliott, Jamirra Curnow T (MD)  (Signed on 15-Jun-13 09:22)  Authored  Last Updated: 15-Jun-13 09:22 by Scot JunElliott, Kianah Harries T (MD)

## 2015-02-26 NOTE — Consult Note (Signed)
PATIENT NAME:  Douglas Haney, Douglas Haney MR#:  [id number] DATE OF BIRTH:  Apr 24, 1946  DATE OF CONSULTATION:  04/17/2012  REFERRING PHYSICIAN:  Bethann Punches, MD  CONSULTING PHYSICIAN:  Lynnae Prude, MD/Nidia Grogan A. Arvilla Market, ANP  REASON FOR CONSULTATION: Reflux symptoms.   HISTORY OF PRESENT ILLNESS: This 69 year old patient of Dr. Bethann Punches was admitted to the hospital for left-sided chest pain. The patient reports for the last three weeks he has had progressive chest pain sometimes to the right of the sternum, other times directly over the mid sternum. This is associated with nausea, profound fatigue, and shortness of breath with exertion. This chest pressure could last 20 minutes. It is not related to eating meals. He has actually found no specific triggers. The patient is set up for a cardiac catheterization this afternoon.   This patient has a long history of heartburn, has been utilizing over-the-counter antacids for several years. Remote EGD was performed in 1996 for chest pain and heartburn, notable for mild antral gastritis, distal one esophagitis, and H. pylori treatment. The patient says he has had some cervical throat dysphagia after he had cervical fusion, and this has been stable for several years postsurgery. He has had no vomiting. He does notice nausea that waxes and wanes, and also he always has a full sensation like he just ate a big meal. He says he has been surviving with eating smaller more frequent meals throughout the day. The patient denies weight loss, reports his baseline weight is in the 130s; but a family member has reported that his clothes are fitting more loosely. He has noted heartburn symptoms at night causing him to sit up at night. He has noted no blood or melena in the stool, and bowel habits are normal. Recent colonoscopy performed in May showed internal hemorrhoids. He has not had a recent upper endoscopy study. GI has been asked to see the patient regarding further evaluation  and management.   PAST MEDICAL HISTORY: 1. Non-insulin-dependent diabetes mellitus.  2. Reflux esophagitis.  3. Benign prostatic hypertrophy.  4. Osteoarthritis.  5. Renal nephrolithiasis.  6. Lumbar disk disease, L5-S1.  7. Cervical stenosis C5-C6.  8. Right basal ganglia lacunar infarct in 2004.   PAST SURGICAL HISTORY:  1. Cervical C4 through C6 fusion in April 2007.  2. Vasectomy.  3. Tonsillectomy.   MEDICATIONS:  1. Januvia 25 mg daily.  2. Ambien 5 mg at night.  3. Metformin 500 mg daily, but he does not take it every day. He takes it when his blood sugars are high.  4. Norco 5/325 mg daily.  5. Flexeril 10 mg t.i.d. as needed.   ALLERGIES: Relafen, sulfa. [facility] notes also show Arthrotec and Cipro cause nausea.   HABITS: Negative tobacco or alcohol. Retired Dietitian.   FAMILY HISTORY: First-degree cousin with colon cancer. Mother deceased with accident in a train wreck. Father deceased at age 53.   REVIEW OF SYSTEMS: CONSTITUTIONAL: He denies fevers or chills, night sweats. He does have weakness, fatigue, cannot maintain normal activities of daily living; and this has been ongoing for the last several months, worse in the last three weeks. Some weight loss reported by family. CARDIOVASCULAR: The patient has had chest pain, exertional dyspnea, no palpitations, dizziness, lightheadedness, or syncope. RESPIRATORY: No cough, wheezing, sputum production. GI: Nausea, bloating, early satiety, multiple episodes of belching, no discreet abdominal pain or tenderness, and some chest pain and decreased dietary intake. GU: Denies dysuria, hematuria, has had voiding every hour since he  has been in the hospital but attributes that to the IV and IV fluids. ENDOCRINE: Positive diabetes, reports he has had some eye changes, implants, wears glasses. SKIN: He did have a tick bite, had doxycycline empiric treatment. He took the medication for 2 to 3 days, and it made his stomach hurt  worse and he discontinued it. MUSCULOSKELETAL: Neck pain, chronic, unchanged, has plates in the neck. He has never used Percocet, according to the wife. He does utilize Flexeril and has had decreasing dose of Norco recently. NEUROLOGIC: No seizure activity, unusual numbness or weakness. PSYCHIATRIC: No history of anxiety, insomnia.   PHYSICAL EXAMINATION:  VITAL SIGNS: 98.3, 52, 120/75, 97.8 pulse oximetry on room air.  GENERAL: The patient is sitting in a  chair by the window in no acute distress.   HEENT: Head is normocephalic. Conjunctiva is pale pink. Tongue is dry and coated.   NECK: Supple. Trachea midline. Cervical incision well healed. The patient has very poor range of motion to the neck.   HEART: Heart tones, S1, S2 are positive. Positive click, heart murmur noted. Regular rhythm.   LUNGS: Clear to auscultation. Respirations eupneic.   ABDOMEN: Flat, soft. Bowel sounds are normal throughout. No hepatosplenomegaly. No tenderness.   RECTAL: Exam is deferred.   SKIN: Warm and dry without rash.   EXTREMITIES: Lower extremities without edema.   MUSCULOSKELETAL: Decreased muscle tone noted, decreased muscle mass noted-especially legs and arms, no joint pain, swelling or inflammation noted. He is utilizing a towel wrapped around his posterior neck for neck support. No discrete tenderness in the neck.   NEUROLOGIC: Cranial nerves II through XII are grossly intact.   PSYCHIATRIC: Affect and mood are within normal. He may be a little anxious and a little aggravated at having to be in the hospital and go through with testing. He states he is a little concerned about having complications from the heart catheterization.   LABORATORY, DIAGNOSTIC AND RADIOLOGICAL DATA:  Admission glucose 281, BUN 23, creatinine 1.20, electrolytes otherwise unremarkable.  Liver panel unremarkable including albumin 3.9.  CPK-MB 6.5, second study was 3.8. Troponin 0.02 x 2.  Hemoglobin is 13.9 and WBC 10.4,  platelet count 222.  Repeat laboratory studies performed 04/17/2012 with glucose down to 166, BUN 24, electrolytes remains unremarkable.  WBC 10.7. Hemoglobin remains 13.7.  Third CPK down to 3.4. Troponin less than 0.02.  Chest x-ray, portable single view, with no acute pulmonary disease. The lungs are clear. No evidence of congestive heart failure.  Abdominal ultrasound performed 04/17/2012 for right upper quadrant pain and leukocytosis revealed normal liver, normal, gallbladder.   IMPRESSION: The patient is with chronic reflux symptoms dating back he notes at least six years. Remote EGD in 1996 revealed mild gastritis, esophagitis and H. pylori with treatment. For the last several months, he has been having escalating heartburn, in the last three weeks more significant to the point of daily nausea, chest pain, multiple belching. Family reports some mild undisclosed weight loss. The patient has early satiety. He has been utilizing over-the-counter antacids. He denies NSAID use. He is a diabetic, poorly controlled. The patient is also having profound fatigue, dyspnea on exertion, and is undergoing a cardiac catheterization this afternoon for elevated cardiac enzymes and this history.    PLAN: Proceed with cardiac clearance and would recommend continued GI evaluation once this is medically feasible. In the meantime, continue with Protonix IV twice daily. Colonoscopy is up-to-date. The patient will need to have an upper endoscopy when it is  medically feasible. Abdominal ultrasound was obtained and negative as noted.   Thank you for the consultation on this patient.   These services provided by Cala BradfordKimberly A. Arvilla MarketMills, MS, APRN, BC, ANP, under  collaborative agreement with Scot Junobert T. Elliott, MD.  ____________________________ Ranae PlumberKimberly A. Arvilla MarketMills, ANP kam:cbb D: 04/17/2012 14:31:39 ET T: 04/17/2012 17:16:53 ET JOB#: [id number]  cc: Cala BradfordKimberly A. Arvilla MarketMills, ANP, <Dictator> Danella PentonMark F. Miller, MD Ranae PlumberKimberly A. Suzette BattiestMills RN,  MSN, ANP-BC Adult Nurse Practitioner ELECTRONICALLY SIGNED 04/21/2012 16:42

## 2015-02-26 NOTE — Consult Note (Signed)
Brief Consult Note: Diagnosis: Chest pain, nausea/fullness, fatigue, SOB, weight loss-cardiac catherization ordered for today.   Patient was seen by consultant.   Consult note dictated.   Comments: Chronic reflux symptoms treated for years with OTC antacids. Remote EGD 1996 for chest pain/reflux showed mild antral gastritis/distal garde I esophagitis. Screening colonoscopy in 2006 showed 2 hyperplastic 3mm sessile polps in sigmoid and rectum and diverticulosis. Plan: Cardiac clearance and then gi evaluation when medically feasible. Continue Protonix IV bid.  Electronic Signatures: Rowan BlaseMills, Jermey Closs Ann (NP)  (Signed 14-Jun-13 14:16)  Authored: Brief Consult Note   Last Updated: 14-Jun-13 14:16 by Rowan BlaseMills, Celestine Bougie Ann (NP)

## 2015-07-25 ENCOUNTER — Encounter: Payer: Self-pay | Admitting: Internal Medicine

## 2023-08-22 ENCOUNTER — Ambulatory Visit
Admission: EM | Admit: 2023-08-22 | Discharge: 2023-08-22 | Disposition: A | Discharge status: Home / Self Care | Payer: Medicare PPO

## 2023-08-22 DIAGNOSIS — S0101XA Laceration without foreign body of scalp, initial encounter: Secondary | ICD-10-CM | POA: Diagnosis not present

## 2023-08-22 MED ORDER — MUPIROCIN 2 % EX OINT: TOPICAL_OINTMENT | CUTANEOUS | 0 refills | Status: AC

## 2023-08-22 MED ORDER — CEPHALEXIN 500 MG PO CAPS: 500.0000 mg | ORAL_CAPSULE | Freq: Four times a day (QID) | ORAL | 0 refills | Status: AC

## 2023-08-22 MED ORDER — POVIDONE-IODINE 10 % EX SOLN: CUTANEOUS | Status: DC | PRN

## 2023-08-22 MED ORDER — LIDOCAINE-EPINEPHRINE 1 %-1:100000 IJ SOLN
10.0000 mL | Freq: Once | INTRAMUSCULAR | Status: AC
  Administered 2023-08-22: 10 mL

## 2023-08-22 MED ORDER — TRIPLE ANTIBIOTIC 3.5-400-5000 EX OINT
1.0000 | TOPICAL_OINTMENT | Freq: Once | CUTANEOUS | Status: AC
  Administered 2023-08-22: 1 via CUTANEOUS

## 2023-08-22 NOTE — ED Triage Notes (Signed)
Patient to Urgent Care with complaints of a head laceration. Reports that he stumbled and caught the right side of his scalp on a bush hog.  Bleeding well controlled at this time. Wife cleaned the area.   TDAP in 9/ 2020.

## 2023-08-22 NOTE — Discharge Instructions (Addendum)
Keep Wound clean and dry, remove staples in 5-7 days.  Take Meds as directed.  Return as needed

## 2023-08-22 NOTE — ED Provider Notes (Signed)
Douglas Haney    CSN: [id number] Arrival date & time: 08/22/23  1251      History   Chief Complaint Chief Complaint  Patient presents with   Head Laceration    HPI Douglas Haney is a 77 y.o. male.   77 year old male pt, Douglas Haney, presents to urgent care for evaluation of head laceration.  Patient states he stumbled and caught right side of his scalp on bush hog prior to arrival.  Bleeding is controlled at this time.  Patient denies any LOC or vomiting wife cleaned the area well prior to arrival.  Last Tdap 07/25/2019(up to date).  The history is provided by the patient and the spouse. No language interpreter was used.    Past Medical History:  Diagnosis Date   Degenerative disk disease    Diabetes mellitus without complication (HCC)    Gastroparesis    Heart murmur     Patient Active Problem List   Diagnosis Date Noted   Scalp laceration, initial encounter 08/22/2023    Past Surgical History:  Procedure Laterality Date   AORTIC VALVE REPLACEMENT (AVR)/CORONARY ARTERY BYPASS GRAFTING (CABG)  04/27/12   AT [facility]   CATARACT EXTRACTION, BILATERAL     CERVICAL DISC SURGERY  2007   COLONOSCOPY  02/22/2005   UPPER GASTROINTESTINAL ENDOSCOPY  12/15/12       Home Medications    Prior to Admission medications   Medication Sig Start Date End Date Taking? Authorizing Provider  cephALEXin (KEFLEX) 500 MG capsule Take 1 capsule (500 mg total) by mouth 4 (four) times daily for 7 days. 08/22/23 08/29/23 Yes Anaya Bovee, Para March, NP  insulin detemir (LEVEMIR FLEXPEN) 100 UNIT/ML FlexPen Inject into the skin. 04/28/19  Yes [provider]  levothyroxine (SYNTHROID) 25 MCG tablet Take by mouth. 04/21/23 04/20/24 Yes [provider]  mupirocin ointment (BACTROBAN) 2 % Scalp bid x 7 days 08/22/23  Yes Lisette Mancebo, Para March, NP  aspirin EC 81 MG tablet Take 1 tablet by mouth daily.    [provider]  atorvastatin (LIPITOR) 20 MG tablet Take 1  tablet by mouth daily. 06/28/13   [provider]  carvedilol (COREG) 3.125 MG tablet Take 1 tablet by mouth 2 (two) times daily. 06/28/13   [provider]  diphenhydrAMINE (BENADRYL) 25 MG tablet Take 25 mg by mouth at bedtime as needed.    [provider]  glucose blood (FREESTYLE LITE) test strip Use to check blood sugar 4 times a day 06/23/12   [provider]  Insulin Aspart (NOVOLOG FLEXPEN [place]) Inject 1 each into the skin 3 (three) times daily with meals. 06/01/13   [provider]  Insulin Glargine (LANTUS SOLOSTAR) 100 UNIT/ML Solostar Pen Inject 10 Units into the skin every evening. 06/29/13   [provider]  Insulin Pen Needle (B-D ULTRAFINE III SHORT PEN) 31G X 8 MM MISC Use for insulin pen 4 times a day 10/15/13   [provider]  MOVIPREP 100 G SOLR MoviPrep (no substitutions)-TAKE AS DIRECTED. 04/18/14   Pyrtle, Carie Caddy, MD  Omeprazole (RA OMEPRAZOLE) 20 MG TBEC Take 1 tablet by mouth at bedtime. 12/11/12   [provider]  valACYclovir (VALTREX) 500 MG tablet Take 1 tablet by mouth daily. 01/27/14   [provider]    Family History Family History  Problem Relation Age of Onset   Colon cancer Cousin 19       1st cousin    Social History Social History  Tobacco Use   Smoking status: Never   Smokeless tobacco: Never  Substance Use Topics   Alcohol use: No   Drug use: No     Allergies   Heparin, Relafen [nabumetone], and Sulfa antibiotics   Review of Systems Review of Systems  Constitutional:  Negative for fever.  Skin:  Positive for color change and wound.  Neurological:  Negative for dizziness, numbness and headaches.  All other systems reviewed and are negative.    Physical Exam Triage Vital Signs ED Triage Vitals  Encounter Vitals Group     BP      Systolic BP Percentile      Diastolic BP Percentile      Pulse      Resp      Temp      Temp src      SpO2      Weight       Height      Head Circumference      Peak Flow      Pain Score      Pain Loc      Pain Education      Exclude from Growth Chart    No data found.  Updated Vital Signs BP 118/70   Pulse 92   Temp 97.7 F (36.5 C)   Resp 18   SpO2 96%   Visual Acuity Right Eye Distance:   Left Eye Distance:   Bilateral Distance:    Right Eye Near:   Left Eye Near:    Bilateral Near:     Physical Exam Vitals and nursing note reviewed.  Constitutional:      Appearance: Normal appearance. He is well-developed and well-groomed.  HENT:     Head: Normocephalic.   Eyes:     General: Lids are normal.     Pupils: Pupils are equal, round, and reactive to light.  Cardiovascular:     Rate and Rhythm: Normal rate.  Pulmonary:     Effort: Pulmonary effort is normal.  Skin:    General: Skin is warm.     Capillary Refill: Capillary refill takes less than 2 seconds.     Findings: Laceration and wound present.       Neurological:     General: No focal deficit present.     Mental Status: He is alert and oriented to person, place, and time.     GCS: GCS eye subscore is 4. GCS verbal subscore is 5. GCS motor subscore is 6.     Cranial Nerves: No cranial nerve deficit.     Sensory: No sensory deficit.  Psychiatric:        Attention and Perception: Attention normal.        Mood and Affect: Mood normal.        Speech: Speech normal.        Behavior: Behavior normal. Behavior is cooperative.      UC Treatments / Results  Labs (all labs ordered are listed, but only abnormal results are displayed) Labs Reviewed - No data to display  EKG   Radiology No results found.  Procedures Laceration Repair  Date/Time: 08/22/2023 1:10 PM  Performed by: Clancy Gourd, NP Authorized by: Clancy Gourd, NP   Consent:    Consent obtained:  Verbal   Consent given by:  Patient   Risks, benefits, and alternatives were discussed: yes     Risks discussed:  Infection, need for additional  repair, nerve damage, pain, poor cosmetic result and poor  wound healing Universal protocol:    Procedure explained and questions answered to patient or proxy's satisfaction: yes     Relevant documents present and verified: yes     Site/side marked: yes     Immediately prior to procedure, a time out was called: yes     Patient identity confirmed:  Verbally with patient, arm band, provided demographic data and hospital-assigned identification number Anesthesia:    Anesthesia method:  Local infiltration   Local anesthetic:  Lidocaine 1% WITH epi Laceration details:    Location:  Scalp   Scalp location:  Frontal   Length (cm):  3 Pre-procedure details:    Preparation:  Patient was prepped and draped in usual sterile fashion Exploration:    Hemostasis achieved with:  Epinephrine and direct pressure   Wound extent: no foreign bodies/material noted     Contaminated: no   Treatment:    Area cleansed with:  Povidone-iodine and saline   Amount of cleaning:  Extensive   Irrigation solution:  Sterile saline   Irrigation method:  Syringe   Visualized foreign bodies/material removed: no     Debridement:  None   Undermining:  None Skin repair:    Repair method:  Staples   Number of staples:  5 Approximation:    Approximation:  Close Repair type:    Repair type:  Simple Post-procedure details:    Dressing:  Antibiotic ointment   Procedure completion:  Tolerated well, no immediate complications  (including critical care time)  Medications Ordered in UC Medications  povidone-iodine (BETADINE) 10 % external solution ( Topical Given 08/22/23 1309)  neomycin-bacitracin-polymyxin [phone] OINT 1 Application (1 Application Apply externally Given 08/22/23 1309)  lidocaine-EPINEPHrine (XYLOCAINE W/EPI) 1 %-1:100000 (with pres) injection 10 mL (10 mLs Infiltration Given by Other 08/22/23 1309)    Initial Impression / Assessment and Plan / UC Course  I have reviewed the triage vital signs  and the nursing notes.  Pertinent labs & imaging results that were available during my care of the patient were reviewed by me and considered in my medical decision making (see chart for details).     Ddx: Scalp laceration, head injury, fall Final Clinical Impressions(s) / UC Diagnoses   Final diagnoses:  Scalp laceration, initial encounter     Discharge Instructions      Keep Wound clean and dry, remove staples in 5-7 days.  Take Meds as directed.  Return as needed     ED Prescriptions     Medication Sig Dispense Auth. Provider   cephALEXin (KEFLEX) 500 MG capsule Take 1 capsule (500 mg total) by mouth 4 (four) times daily for 7 days. 28 capsule Kenechukwu Eckstein, NP   mupirocin ointment (BACTROBAN) 2 % Scalp bid x 7 days 66 g Airon Sahni, NP      PDMP not reviewed this encounter.   Clancy Gourd, NP 08/22/23 1340
